# Patient Record
Sex: Male | Born: 1995 | Race: Black or African American | Hispanic: No | Marital: Single | Smoking: Former smoker
Health system: Southern US, Community
[De-identification: ages and names within clinical notes are randomized; demographics above are authoritative.]

## PROBLEM LIST (undated history)

## (undated) DIAGNOSIS — J45909 Unspecified asthma, uncomplicated: Secondary | ICD-10-CM

## (undated) HISTORY — PX: FOOT SURGERY: SHX648

---
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
## 2016-02-28 ENCOUNTER — Emergency Department (HOSPITAL_COMMUNITY): Payer: Medicaid Other

## 2016-02-28 ENCOUNTER — Encounter (HOSPITAL_COMMUNITY): Payer: Self-pay | Admitting: Emergency Medicine

## 2016-02-28 ENCOUNTER — Emergency Department (HOSPITAL_COMMUNITY)
Admission: EM | Admit: 2016-02-28 | Discharge: 2016-02-28 | Disposition: A | Discharge status: Home / Self Care | Payer: Medicaid Other | Attending: Emergency Medicine | Admitting: Emergency Medicine

## 2016-02-28 DIAGNOSIS — J45901 Unspecified asthma with (acute) exacerbation: Secondary | ICD-10-CM

## 2016-02-28 DIAGNOSIS — J069 Acute upper respiratory infection, unspecified: Secondary | ICD-10-CM

## 2016-02-28 DIAGNOSIS — R062 Wheezing: Secondary | ICD-10-CM | POA: Diagnosis present

## 2016-02-28 DIAGNOSIS — Z87891 Personal history of nicotine dependence: Secondary | ICD-10-CM | POA: Diagnosis not present

## 2016-02-28 DIAGNOSIS — R109 Unspecified abdominal pain: Secondary | ICD-10-CM | POA: Insufficient documentation

## 2016-02-28 MED ORDER — ALBUTEROL (5 MG/ML) CONTINUOUS INHALATION SOLN
10.0000 mg/h | INHALATION_SOLUTION | Freq: Once | RESPIRATORY_TRACT | Status: AC
  Administered 2016-02-28: 10 mg/h via RESPIRATORY_TRACT
  Filled 2016-02-28: qty 20

## 2016-02-28 MED ORDER — PREDNISONE 50 MG PO TABS
60.0000 mg | ORAL_TABLET | Freq: Once | ORAL | Status: AC
  Administered 2016-02-28: 60 mg via ORAL
  Filled 2016-02-28: qty 1

## 2016-02-28 MED ORDER — IPRATROPIUM-ALBUTEROL 0.5-2.5 (3) MG/3ML IN SOLN
3.0000 mL | Freq: Once | RESPIRATORY_TRACT | Status: AC
  Administered 2016-02-28: 3 mL via RESPIRATORY_TRACT
  Filled 2016-02-28: qty 3

## 2016-02-28 MED ORDER — ALBUTEROL SULFATE HFA 108 (90 BASE) MCG/ACT IN AERS: 1.0000 | INHALATION_SPRAY | RESPIRATORY_TRACT | 0 refills | Status: AC | PRN

## 2016-02-28 MED ORDER — ALBUTEROL SULFATE (2.5 MG/3ML) 0.083% IN NEBU
2.5000 mg | INHALATION_SOLUTION | Freq: Once | RESPIRATORY_TRACT | Status: AC
  Administered 2016-02-28: 2.5 mg via RESPIRATORY_TRACT
  Filled 2016-02-28: qty 3

## 2016-02-28 MED ORDER — DEXAMETHASONE 4 MG PO TABS: 4.0000 mg | ORAL_TABLET | Freq: Two times a day (BID) | ORAL | 0 refills | Status: AC

## 2016-02-28 NOTE — Discharge Instructions (Signed)
Your chest x-ray is negative. Your vital signs are within normal limits. Your oxygen level is 96% on room air, which is within normal limits. You've been treated with an hour-long breathing treatment. Please be cautious with sudden temperature changes, and exposure to the elements, as these may cause your asthma exacerbation to return. Please use albuterol 2 puffs every 4 hours. Please use Decadron 2 times daily with food. Continue your current medications. Please see your primary physician, or return to the emergency department if not improving.

## 2016-02-28 NOTE — ED Triage Notes (Signed)
Pt has asthma, wheezing bilaterally, using inhaler and nebs without relief, SOB, non productive cough

## 2016-02-28 NOTE — ED Notes (Signed)
Resp notified of continuous neb. Pt placed on heart monitor

## 2016-02-28 NOTE — ED Notes (Addendum)
Respiratory tx called to notify of orders

## 2016-02-28 NOTE — ED Provider Notes (Signed)
Continuation of care from Glori LuisJulie Idol,P. A. C.  Patient is a 20 year old male who has a history of asthma. He presented to the emergency department with an exacerbation of his asthma with cough and wheezing over the last few days.  Patient tolerated hour-long nebulizer treatment without problem. He states that he still has some tightness with breathing, but states he feels a lot better than when he came in. He speaking in complete sentences. There is symmetrical rise and fall of the chest. The lungs have soft end expiratory wheezes on present but patient moving air nicely. There is no rub appreciated.  Color is good. There's no tachypnea noted at this time.  The plan at this time is for the patient to be placed on on Decadron 2 times daily. The patient will be on albuterol 2 puffs every 4 hours. The patient is already on Flonase and QVAR. Patient is to see his primary physician, or return to the emergency department if any changes, problems, or concerns. Patient is in agreement with this plan.  Patient request to return to work tonight. I asked the patient to use his medications on tonight and tomorrow and to wait until tomorrow to go back to work duty. I've given him a work excuse covering him for this.Charles Baldwin.   Charles Porter, PA-C 02/28/16 1832    Charles MuldersScott Zackowski, MD 03/06/16 1341

## 2016-02-28 NOTE — ED Provider Notes (Signed)
[facility] DEPT Provider Note    By signing my name below, I, Earmon PhoenixJennifer Waddell, attest that this documentation has been prepared under the direction and in the presence of Burgess AmorJulie Jezebel Pollet, PA-C. Electronically Signed: Earmon PhoenixJennifer Waddell, ED Scribe. 02/28/16. 2:36 PM.    History   Chief Complaint Chief Complaint  Patient presents with  . Asthma   The history is provided by the patient and medical records. No language interpreter was used.    HPI Comments:  Royal PiedraKadar Clinch is a 20 y.o. male with PMHx of asthma who presents to the Emergency Department complaining of wheezing that began yesterday. He reports associated dry cough, sore throat and left lower chest wall pain. Pt states the pain is worsened by deep breathing. He denies any other modifying factors. He has been using his albuterol MDI and albuterol nebulizer with minimal relief of the symptoms. His last nebulizer treatment was about 9 hours ago. 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 He is a former smoker. He denies fever, chills, nausea, vomiting. His PCP is Dr. Selena BattenKim. He states he was hospitalized for an asthma exacerbation as a child.   Past Medical History:  Diagnosis Date  . Asthma     Patient Active Problem List   Diagnosis Date Noted  . Moderate persistent asthma 07/27/2015  . Allergic rhinitis 07/27/2015    Past Surgical History:  Procedure Laterality Date  . FOOT SURGERY Left 1992   Glass removal       Home Medications    Prior to Admission medications   Medication Sig Start Date End Date Taking? Authorizing Provider  albuterol (PROVENTIL HFA;VENTOLIN HFA) 108 (90 Base) MCG/ACT inhaler 2 Puffs every 4 hours as needed for cough or wheeze.  May use 2 Puffs 10-20 minutes prior to exercise. 11/30/15   Shaylar Larose HiresPatricia Padgett, MD  albuterol (PROVENTIL) (2.5 MG/3ML) 0.083% nebulizer solution Nebulize one vial every 4 hours as  needed for cough or wheeze. 03/30/15   Roselyn Kara MeadM Hicks, MD  beclomethasone (QVAR) 80 MCG/ACT inhaler Inhale 2 puffs into the lungs 2 (two) times daily. 11/30/15   Shaylar Larose HiresPatricia Padgett, MD  cetirizine (ZYRTEC) 10 MG tablet TAKE 1 TABLET BY MOUTH DAILY FOR RUNNY NOSE 07/13/15   Roselyn Kara MeadM Hicks, MD  Cholecalciferol (VITAMIN D PO) Take 1 tablet by mouth daily.    Historical Provider, MD  fluticasone (FLONASE) 50 MCG/ACT nasal spray Place 1-2 sprays into both nostrils daily. Reported on 03/30/2015 Patient not taking: Reported on 07/27/2015 03/30/15   Baxter Hireoselyn M Hicks, MD  imiquimod Mathis Dad(ALDARA) 5 % cream Apply 1 application topically daily. Reported on 07/27/2015 01/25/15   Historical Provider, MD  montelukast (SINGULAIR) 10 MG tablet Take 1 tablet (10 mg total) by mouth at bedtime. 11/30/15   Shaylar Larose HiresPatricia Padgett, MD  NASONEX 50 MCG/ACT nasal spray Inhale 2 sprays into the lungs daily. Reported on 07/27/2015 07/02/14   Historical Provider, MD  Olopatadine HCl 0.2 % SOLN Apply 1 drop to eye daily. Reported on 07/27/2015    Historical Provider, MD  predniSONE (DELTASONE) 20 MG tablet 3 tabs PO daily for 4 days 12/09/15   Burgess AmorJulie Deran Barro, PA-C  vitamin C (ASCORBIC ACID) 500 MG tablet Take 500 mg by mouth daily.    Historical Provider, MD    Family History Family History  Problem Relation Age of Onset  . Asthma Mother   . Allergic rhinitis Mother   .  Angioedema Neg Hx   . Eczema Neg Hx   . Immunodeficiency Neg Hx   . Urticaria Neg Hx     Social History Social History  Substance Use Topics  . Smoking status: Former Smoker    Quit date: 01/23/2015  . Smokeless tobacco: Never Used  . Alcohol use No     Allergies   Patient has no known allergies.   Review of Systems Review of Systems  Constitutional: Negative for chills and fever.  HENT: Positive for sore throat. Negative for congestion.   Eyes: Negative.   Respiratory: Positive for cough, shortness of breath and wheezing. Negative for chest  tightness.   Cardiovascular: Negative for chest pain.  Gastrointestinal: Positive for abdominal pain. Negative for nausea and vomiting.  Genitourinary: Negative.   Musculoskeletal: Negative for arthralgias, joint swelling and neck pain.  Skin: Negative.  Negative for rash and wound.  Neurological: Negative for dizziness, weakness, light-headedness, numbness and headaches.  Psychiatric/Behavioral: Negative.      Physical Exam Updated Vital Signs BP 124/73 (BP Location: Left Arm)   Pulse 85   Temp 98.3 F (36.8 C)   Resp 22   Ht 5\' 7"  (1.702 m)   Wt 135 lb (61.2 kg)   SpO2 98%   BMI 21.14 kg/m   Physical Exam  Constitutional: He appears well-developed and well-nourished.  HENT:  Head: Normocephalic and atraumatic.  Eyes: Conjunctivae are normal.  Neck: Normal range of motion.  Cardiovascular: Normal rate, regular rhythm, normal heart sounds and intact distal pulses.   Pulmonary/Chest: Effort normal. He has wheezes.  Inspiratory and expiratory wheezes throughout all lung fields. Prolong respirations.  Abdominal: Soft. Bowel sounds are normal. There is no tenderness.  Musculoskeletal: Normal range of motion.  Neurological: He is alert.  Skin: Skin is warm and dry.  Psychiatric: He has a normal mood and affect.  Nursing note and vitals reviewed.    ED Treatments / Results  DIAGNOSTIC STUDIES: Oxygen Saturation is 98% on RA, normal by my interpretation.   COORDINATION OF CARE: 2:31 PM- Will order nebulizer treatment and prescribe Prednisone. Pt verbalizes understanding and agrees to plan.  Medications  ipratropium-albuterol (DUONEB) 0.5-2.5 (3) MG/3ML nebulizer solution 3 mL (not administered)  albuterol (PROVENTIL) (2.5 MG/3ML) 0.083% nebulizer solution 2.5 mg (not administered)  predniSONE (DELTASONE) tablet 60 mg (not administered)    Labs (all labs ordered are listed, but only abnormal results are displayed) Labs Reviewed - No data to display  EKG  EKG  Interpretation None       Radiology Dg Chest 2 View  Result Date: 02/28/2016 CLINICAL DATA:  Cough and wheezing for a couple of days. EXAM: CHEST  2 VIEW COMPARISON:  12/26/2014 FINDINGS: The heart size and mediastinal contours are within normal limits. Both lungs are clear. The visualized skeletal structures are unremarkable. IMPRESSION: No active cardiopulmonary disease. Electronically Signed   By: Richarda Overlie M.D.   On: 02/28/2016 12:13    Procedures Procedures (including critical care time)  Medications Ordered in ED Medications  ipratropium-albuterol (DUONEB) 0.5-2.5 (3) MG/3ML nebulizer solution 3 mL (not administered)  albuterol (PROVENTIL) (2.5 MG/3ML) 0.083% nebulizer solution 2.5 mg (not administered)  predniSONE (DELTASONE) tablet 60 mg (not administered)     Initial Impression / Assessment and Plan / ED Course  I have reviewed the triage vital signs and the nursing notes.  Pertinent labs & imaging results that were available during my care of the patient were reviewed by me and considered in my medical decision  making (see chart for details).  Clinical Course    Pt given albuterol/mdi duoneb tx and prednisone 60 mg.  Pt felt minimally improved after this tx.  He was moving better air, but still with insp/exp wheezing.  Added an hour long neb with albuterol 10 mg.    Discussed with Ivery QualeHobson Bryant, PA-C who will assume care.  I personally performed the services described in this documentation, which was scribed in my presence. The recorded information has been reviewed and is accurate.   Final Clinical Impressions(s) / ED Diagnoses   Final diagnoses:  None    New Prescriptions New Prescriptions   No medications on file     Burgess AmorJulie Cassity Christian, PA-C 02/28/16 1716    Vanetta MuldersScott Zackowski, MD 03/06/16 1340

## 2016-03-28 ENCOUNTER — Ambulatory Visit: Payer: Medicaid Other | Admitting: Allergy & Immunology

## 2017-11-21 IMAGING — DX DG CHEST 2V
2 series · 2 of 2 positions shown · non-contrast
Comparison: 12/26/2014

CLINICAL DATA: Cough and wheezing for a couple of days.

EXAM:
CHEST  2 VIEW

[chest pa]
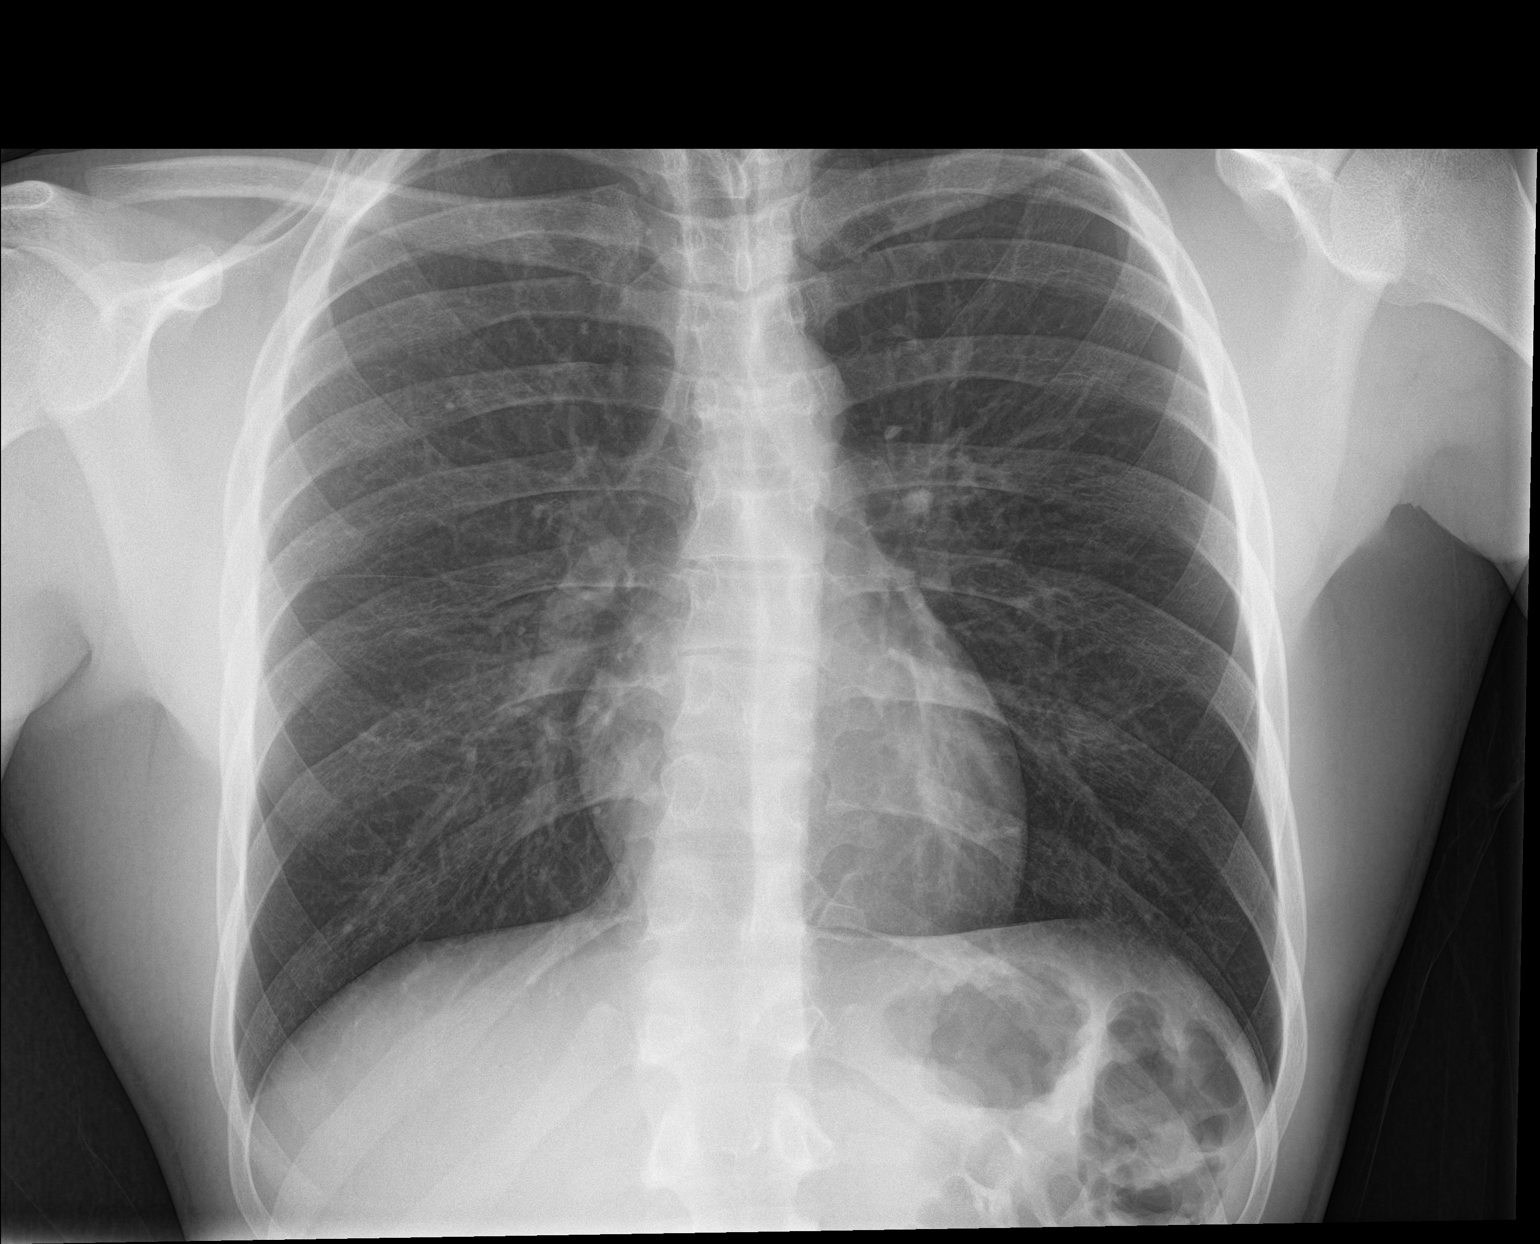

[chest lat]
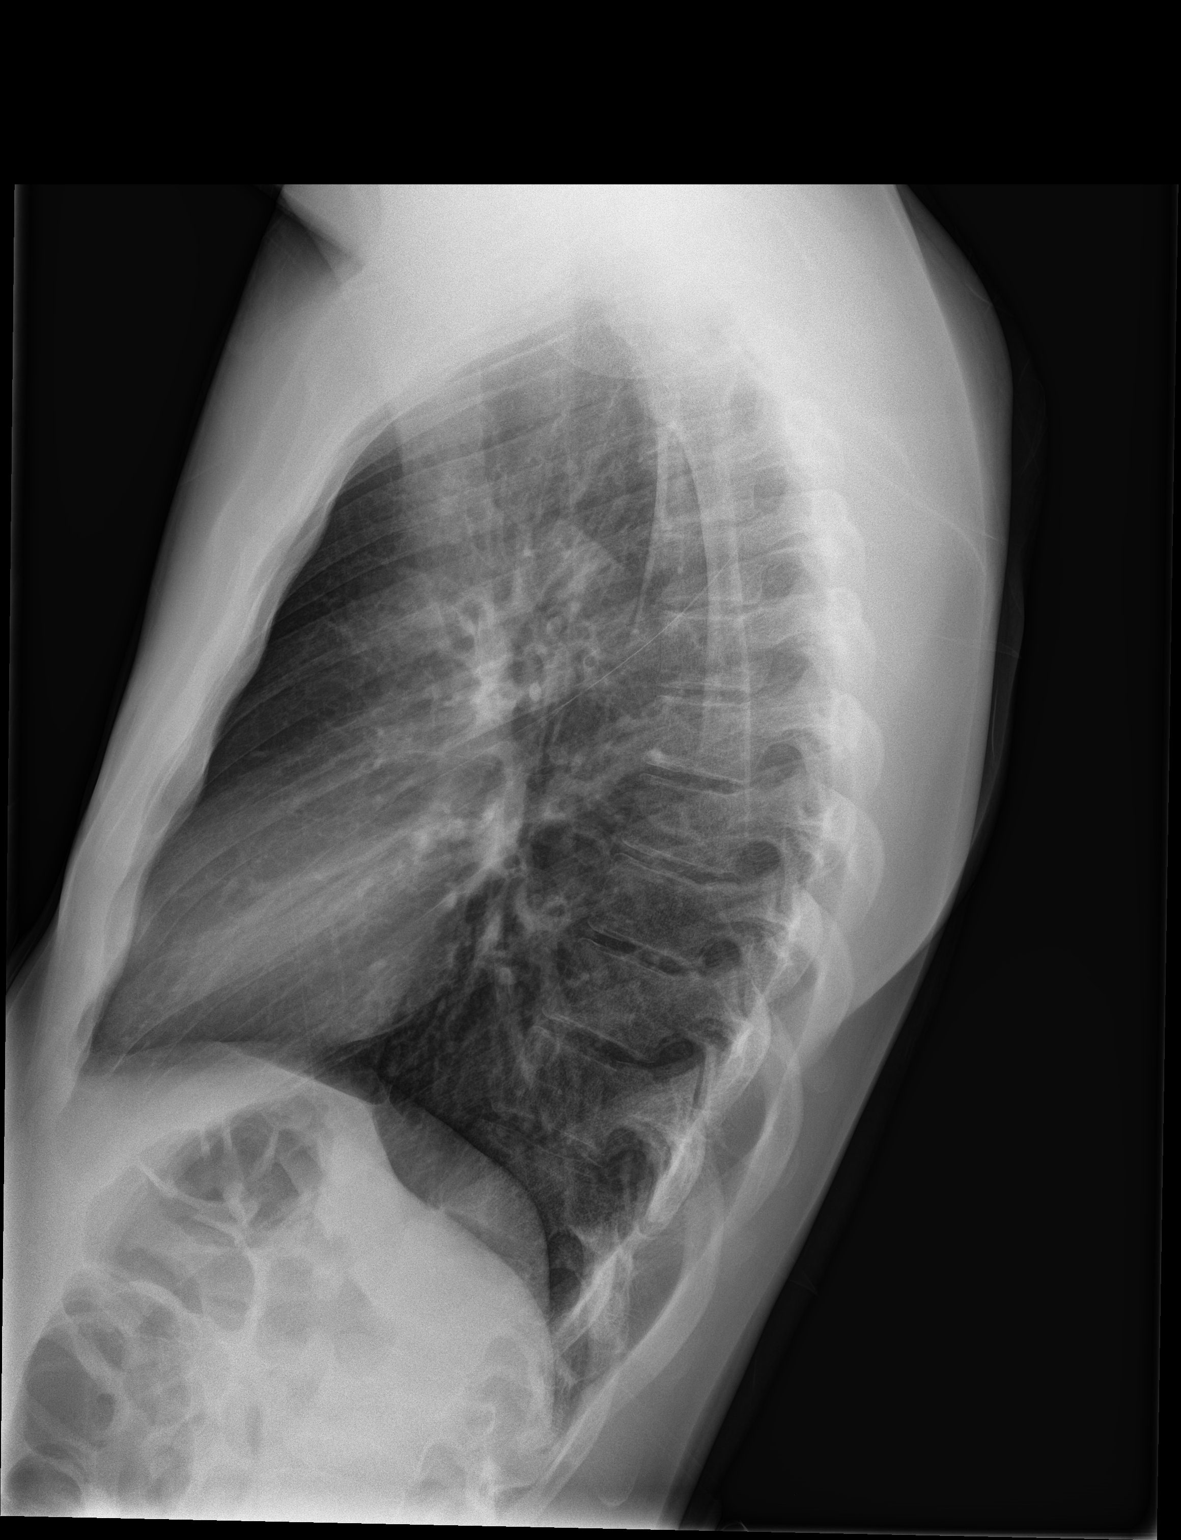

[2 of 2 positions shown; findings below may reference images not displayed]

FINDINGS: The heart size and mediastinal contours are within normal limits.
Both lungs are clear. The visualized skeletal structures are
unremarkable.
IMPRESSION: No active cardiopulmonary disease.

## 2021-04-10 DEATH — deceased
